# Patient Record
Sex: Male | Born: 1990 | Race: Black or African American | Hispanic: No | Marital: Single
Health system: Southern US, Community
[De-identification: ages and names within clinical notes are randomized; demographics above are authoritative.]

---
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
## 2010-05-03 ENCOUNTER — Emergency Department (INDEPENDENT_AMBULATORY_CARE_PROVIDER_SITE_OTHER): Payer: Medicaid Other

## 2010-05-03 ENCOUNTER — Emergency Department (HOSPITAL_BASED_OUTPATIENT_CLINIC_OR_DEPARTMENT_OTHER)
Admission: EM | Admit: 2010-05-03 | Discharge: 2010-05-03 | Disposition: A | Discharge status: Home / Self Care | Payer: Medicaid Other | Attending: Emergency Medicine | Admitting: Emergency Medicine

## 2010-05-03 DIAGNOSIS — M545 Low back pain: Secondary | ICD-10-CM

## 2010-05-03 DIAGNOSIS — Y92410 Unspecified street and highway as the place of occurrence of the external cause: Secondary | ICD-10-CM | POA: Insufficient documentation

## 2010-05-03 DIAGNOSIS — M549 Dorsalgia, unspecified: Secondary | ICD-10-CM | POA: Insufficient documentation

## 2010-05-03 DIAGNOSIS — M542 Cervicalgia: Secondary | ICD-10-CM

## 2010-05-26 NOTE — H&P (Signed)
NAMEJADON, Ryan Mullins NO.:  [id number]   MEDICAL RECORD NO.:  [id number]          PATIENT TYPE:  INP   LOCATION:  0203                          FACILITY:  BH   PHYSICIAN:  Lalla Brothers, MDDATE OF BIRTH:  09-09-1990   DATE OF ADMISSION:  09/23/2006  DATE OF DISCHARGE:                       PSYCHIATRIC ADMISSION ASSESSMENT   IDENTIFICATION:  This 38[age]20/20-year-old male, 11th grade student at  [employer], is admitted emergently involuntarily on a [facility] petition for commitment in transfer from [facility] Emergency Department for inpatient stabilization and  treatment of suicide risk and depression.  The patient was observed by  grandmother to hold his knife to his throat threatening to kill himself  while also threatening to shoot himself.  He then ran out into the  street and laid in the street to die.  He had reported to guardian  grandmother that he had overdosed with 100 unknown pills but then denied  this later.  He was transported by Patent examiner to the hospital using  aggressive language to law enforcement and a counselor in the emergency  department.   HISTORY OF PRESENT ILLNESS:  The patient acknowledges an established  history of ADHD but has been noncompliant with medication for such.  He  denies any counseling or previous psychiatric hospitalization.  He does  not acknowledge the name of an ADHD medication or any recent use.  The  patient is said by police to have bipolar disorder, possibly stated by  family though the patient appears to have no previous diagnosis or  treatment to that effect.  The patient was reported in the emergency  department to have pressured speech, grandiose thoughts and aggressive  impulses so that he was concluded or considered manic in the emergency  department.  He reported a sleep cycle of not sleeping for two days and  then sleeping rather continuously for two days.   The patient reports in  the emergency department that he has not used alcohol or illicit drugs.  However, his urine drug screen is positive for cannabis and he later  admits cannabis abuse.  The patient does not acknowledge organic central  nervous system trauma.  He does not acknowledge specific hallucinations  or paranoia.  He has no specific phobias or anxiety in general.  At the  time of admission, he is taking only asthma and allergy medication.  The  patient presents at the [facility] as depressed.  He has  diminished eye contact and communication, offering very little  elaboration about manifestations or symptoms.  Grandmother promises to  come to the hospital but is several hours late as others realize she may  not come at all initially.   PAST MEDICAL HISTORY:  The patient is under the primary care of [facility].  He has a history of asthma, currently taking Advair though  he does not know the potency every morning.  He is on Singulair 10 mg  every morning and has albuterol inhaler as needed.  He has lactose  intolerance.  He has a tattoo on both hands.  He states he is sexually  active.  He reports allergy to DAIRY products.  He denies any medication  allergies.  He has had no seizure or syncope.  He has had no heart  murmur or arrhythmia.   REVIEW OF SYSTEMS:  The patient denies difficulty with gait, gaze or  continence.  He denies exposure to communicable disease or toxins.  He  denies rash, jaundice or purpura.  There is no headache or sensory loss.  There is no memory loss or coordination deficit currently.  There is no  cough, dyspnea, chest pain, wheeze, palpitations or presyncope.  There  is no abdominal pain, nausea, vomiting or diarrhea.  There is no dysuria  or arthralgia.   IMMUNIZATIONS:  Up-to-date.   FAMILY HISTORY:  The patient lives with guardian grandmother but offers  little other family history at this time.  The patient claims  that  guardian grandmother is unfairly considering him mentally ill and to  have problems.   SOCIAL AND DEVELOPMENTAL HISTORY:  The patient is an 11th grade student  at [employer].  He wants out of the hospital so he can start  driver's ed September 26, 2006.  He will not address academic or conduct  standings at school otherwise.  He does not acknowledge other legal  charges.  He does not acknowledge substance abuse other than sometimes  acknowledging his cannabis abuse and his urine drug screen was positive  in the emergency department for cannabis.  He is currently denying other  legal involvement.   ASSETS:  The patient seems to have some ethics and morals although he  hides these behind threats.   MENTAL STATUS EXAM:  Height is 172.8 cm and weight is 77 kg.  Blood  pressure is 123/69 with heart rate of 81 (sitting) and 126/77 with heart  rate of 95 (standing).  He is left-handed.  He is alert and oriented,  being somewhat vigilant while wanting to hide his head.  Cranial nerves  2-12 are intact.  Muscle strengths and tone are normal.  There are no  pathologic reflexes or soft neurologic findings.  There are no abnormal  involuntary movements.  Gait and gaze are intact.  The patient feels  unfairly treated, being dismissive relative to the charges and  consequences for his threats.  He has modest anxiety but severe agitated  dysphoria.  He will not participate for understanding his threats and  risk-taking.  Suicidal ideation is evident and he has multiple plans  beginning to act upon such, particularly by lying in the road and  holding a knife to his neck.  He does not present homicidality.  He is  noncompliant with his ADHD treatment.  He is motivated to start driver's  ed treatment though not necessarily psychologically capable at this  time.   IMPRESSION:  AXIS I:  Major depression, single episode, with agitated  features.  Attention-deficit hyperactivity disorder,  combined-subtype,  moderate severity.  Oppositional defiant disorder.  Cannabis abuse.  Other interpersonal problem.  Other specified family circumstances.  Parent-child problem.  Noncompliance with treatment.  AXIS II:  Diagnosis deferred.  AXIS III:  Allergic rhinitis and asthma, lactose intolerance.  AXIS IV:  Stressors:  Family--severe, acute and chronic; phase of life--  severe, acute and chronic; school--moderate, acute and chronic.  AXIS V:  GAF on admission 30; with highest in last year 64.   PLAN:  The patient is admitted for inpatient adolescent psychiatric and  multidisciplinary multimodal behavioral  health treatment in a team-based  programmatic locked psychiatric unit.  Will consider Zoloft  pharmacotherapy and Wellbutrin may be a second choice.  Will monitor  closely mood swings and any manic symptoms, particularly prior to  definitely initiating antidepressant in case mania instead of agitation  symptoms were being observed in the emergency room.  Cognitive  behavioral therapy, anger management, interpersonal therapy, family  therapy, empathy training, social and communication skill training,  substance abuse intervention and problem-solving and coping skill  training can be undertaken.   ESTIMATED LENGTH OF STAY:  Seven days with target symptoms for discharge  being stabilization of suicide risk and mood, stabilization of  dangerous, disruptive behavior and substance use and generalization of  the capacity for safe, effective participation in outpatient treatment  and in residence with grandmother.      Lalla Brothers, MD  Electronically Signed    GEJ/MEDQ  D:  09/24/2006  T:  09/24/2006  Job:  [id number]
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
## 2010-10-23 LAB — URINALYSIS, ROUTINE W REFLEX MICROSCOPIC
Bilirubin Urine: NEGATIVE
Ketones, ur: 15 — AB
Nitrite: NEGATIVE
Protein, ur: NEGATIVE
Urobilinogen, UA: 0.2

## 2010-10-23 LAB — LIPID PANEL
Cholesterol: 120
HDL: 40
Total CHOL/HDL Ratio: 3
VLDL: 13

## 2010-10-23 LAB — URINE MICROSCOPIC-ADD ON

## 2010-10-23 LAB — RPR: RPR Ser Ql: NONREACTIVE

## 2010-10-23 LAB — GC/CHLAMYDIA PROBE AMP, URINE
Chlamydia, Swab/Urine, PCR: NEGATIVE
GC Probe Amp, Urine: NEGATIVE

## 2010-10-23 LAB — TSH: TSH: 1.39
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
## 2015-05-12 DEATH — deceased

## 2015-06-05 ENCOUNTER — Telehealth: Payer: Self-pay | Admitting: Family Medicine

## 2015-06-05 NOTE — Telephone Encounter (Signed)
Noted. Pt is not in our practice. No future appointment is in EP[facility].
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
